# Patient Record
Sex: Female | Born: 1960 | Hispanic: No | Marital: Single | Smoking: Never smoker
Health system: Southern US, Community
[De-identification: ages and names within clinical notes are randomized; demographics above are authoritative.]

## PROBLEM LIST (undated history)

## (undated) DIAGNOSIS — E559 Vitamin D deficiency, unspecified: Secondary | ICD-10-CM

## (undated) DIAGNOSIS — M545 Low back pain, unspecified: Secondary | ICD-10-CM

## (undated) DIAGNOSIS — R6882 Decreased libido: Secondary | ICD-10-CM

## (undated) DIAGNOSIS — R87610 Atypical squamous cells of undetermined significance on cytologic smear of cervix (ASC-US): Secondary | ICD-10-CM

## (undated) DIAGNOSIS — Z309 Encounter for contraceptive management, unspecified: Secondary | ICD-10-CM

## (undated) DIAGNOSIS — B019 Varicella without complication: Secondary | ICD-10-CM

## (undated) DIAGNOSIS — R5381 Other malaise: Secondary | ICD-10-CM

## (undated) DIAGNOSIS — R5383 Other fatigue: Secondary | ICD-10-CM

## (undated) DIAGNOSIS — G43109 Migraine with aura, not intractable, without status migrainosus: Secondary | ICD-10-CM

## (undated) HISTORY — DX: Vitamin D deficiency, unspecified: E55.9

## (undated) HISTORY — DX: Atypical squamous cells of undetermined significance on cytologic smear of cervix (ASC-US): R87.610

## (undated) HISTORY — DX: Migraine with aura, not intractable, without status migrainosus: G43.109

## (undated) HISTORY — DX: Varicella without complication: B01.9

## (undated) HISTORY — DX: Decreased libido: R68.82

## (undated) HISTORY — DX: Low back pain, unspecified: M54.50

## (undated) HISTORY — PX: OTHER SURGICAL HISTORY: SHX169

## (undated) HISTORY — DX: Other malaise: R53.81

## (undated) HISTORY — DX: Encounter for contraceptive management, unspecified: Z30.9

## (undated) HISTORY — DX: Low back pain: M54.5

## (undated) HISTORY — DX: Other fatigue: R53.83

---

## 2008-03-08 ENCOUNTER — Emergency Department: Payer: Self-pay | Admitting: Internal Medicine

## 2009-03-01 LAB — HM PAP SMEAR

## 2010-09-17 IMAGING — CT CT HEAD WITHOUT CONTRAST
1 series · 16 of 29 positions shown, 20 images · non-contrast
Comparison: none

RESULT:      Noncontrast emergent CT of the brain demonstrates prominence of
the ventricles and sulci consistent with atrophy. There is no hemorrhage,
mass-effect or midline shift. There is no extra-axial hematoma. There is no
territorial infarct. The sinuses and mastoids are unremarkable aside from
chronic sclerotic changes in the mastoids. The calvarium is intact.

[Series 2: soft tissue · axial · 0.41mm/px · z∈[+852,+982]mm · 16 of 29 slices shown, 20 images]
[im 2/29  brain]
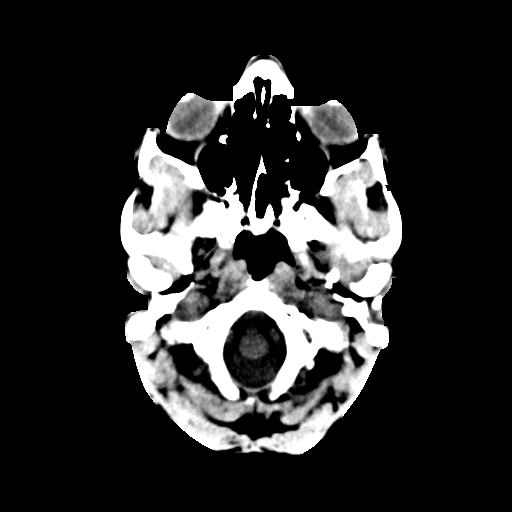
[im 2/29  bone]
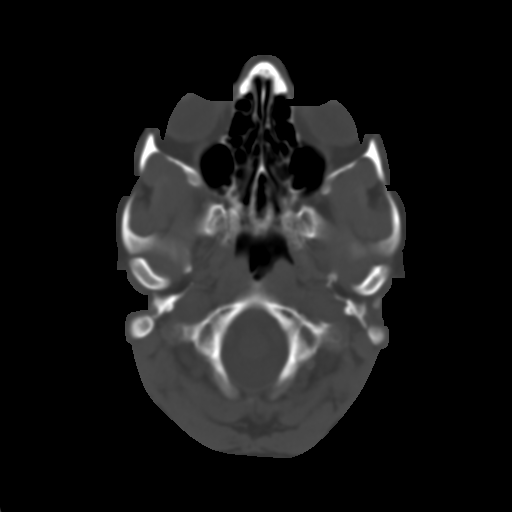
[im 4/29  brain]
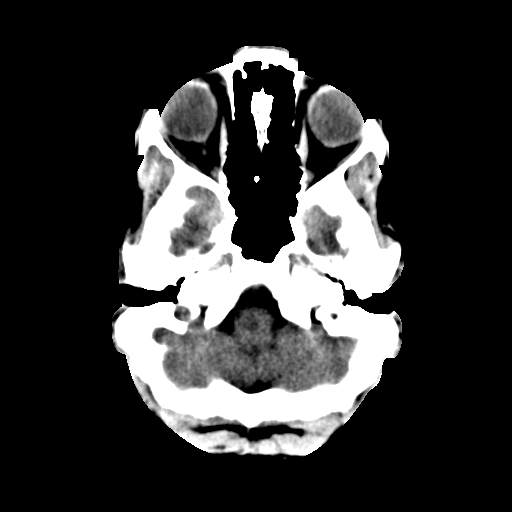
[im 6/29  brain]
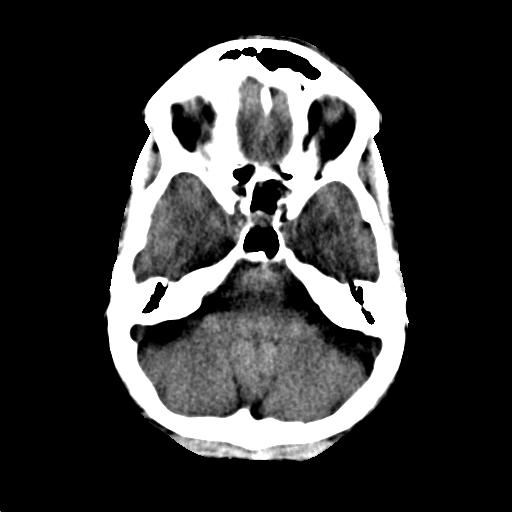
[im 7/29  brain]
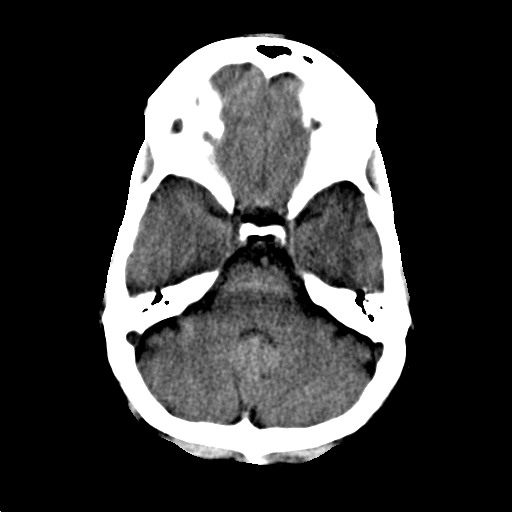
[im 9/29  brain]
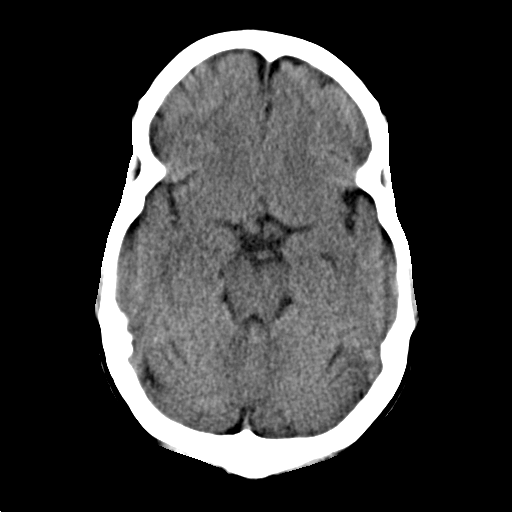
[im 9/29  bone]
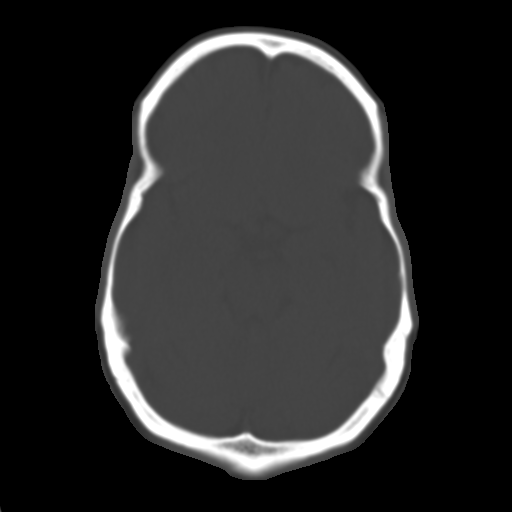
[im 11/29  brain]
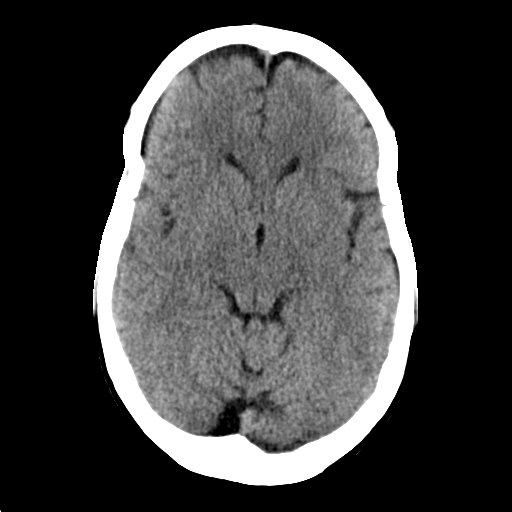
[im 12/29  brain]
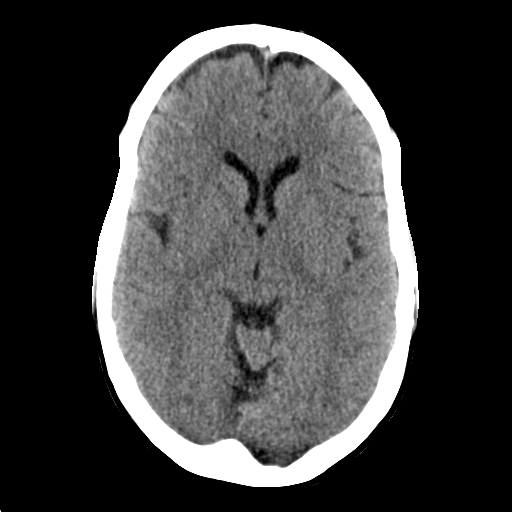
[im 14/29  brain]
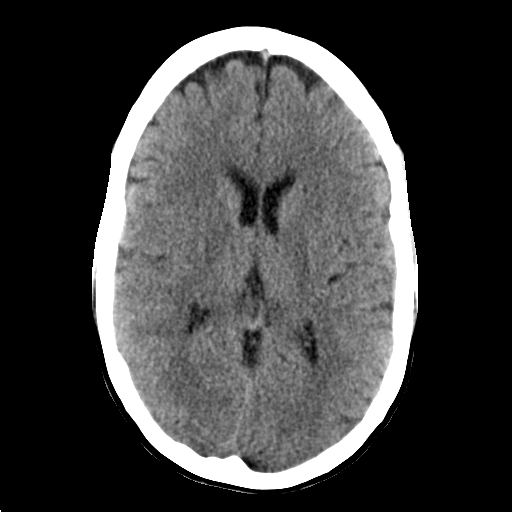
[im 16/29  brain]
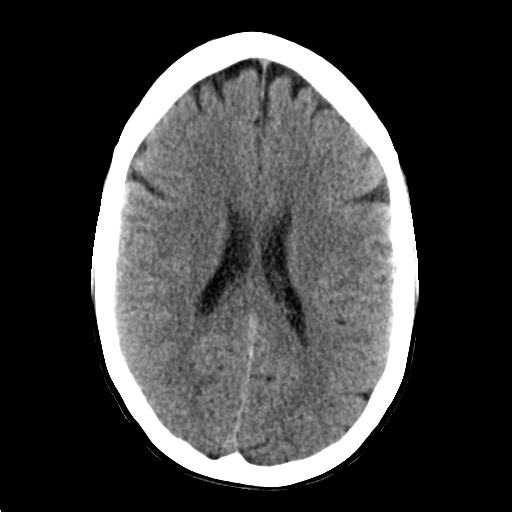
[im 16/29  bone]
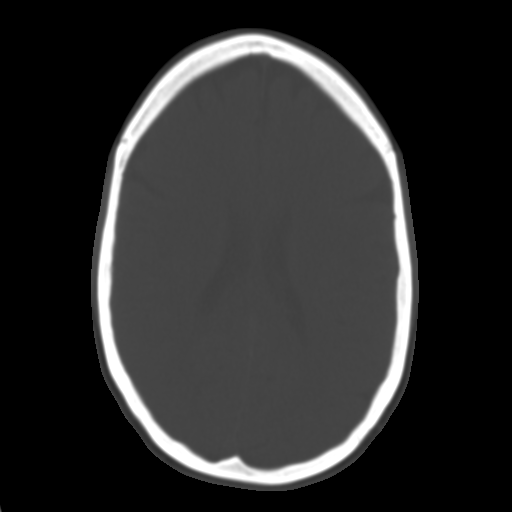
[im 18/29  brain]
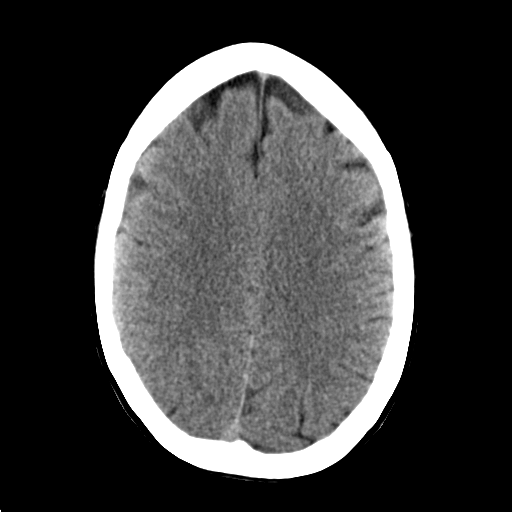
[im 19/29  brain]
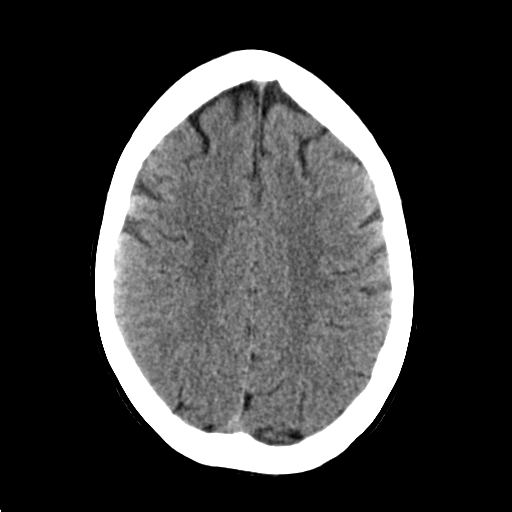
[im 21/29  brain]
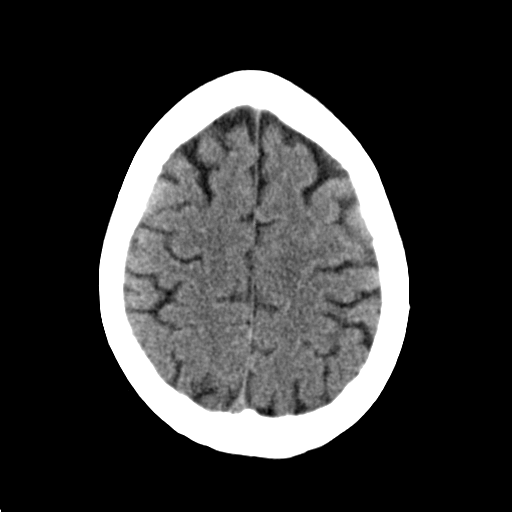
[im 23/29  brain]
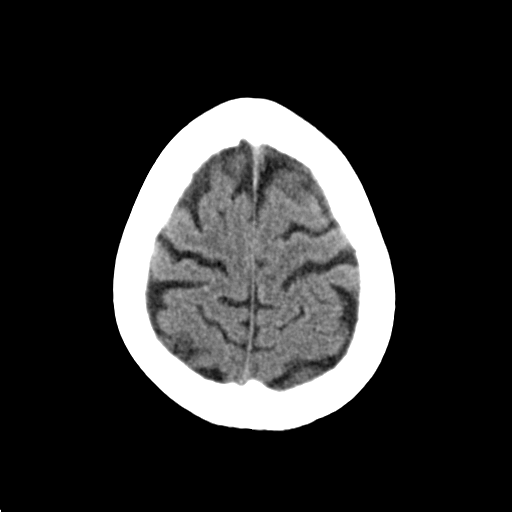
[im 23/29  bone]
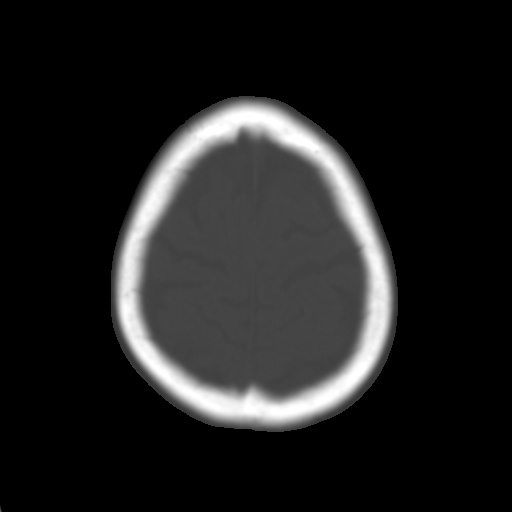
[im 24/29  brain]
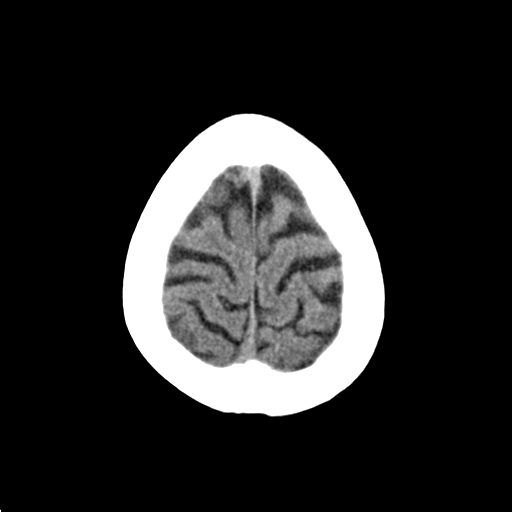
[im 26/29  brain]
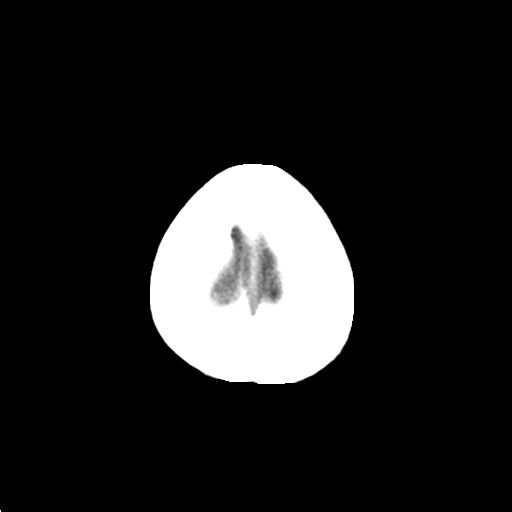
[im 28/29  brain]
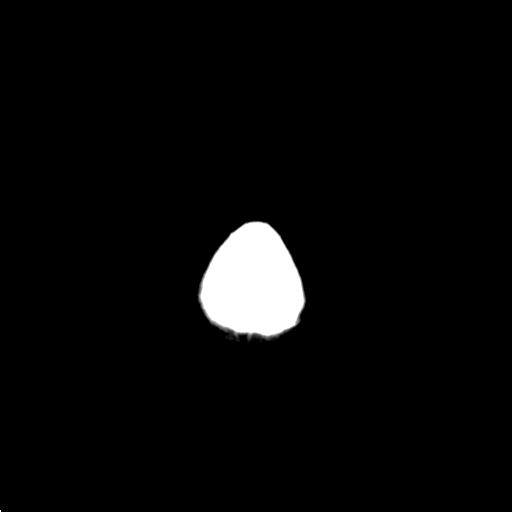

[16 of 29 positions shown; findings below may reference images not displayed]

IMPRESSION: 1. Changes of atrophy and chronic microvascular ischemic disease.
2. No acute intracranial abnormality.

Addendum:
There are is mild diffuse atrophy. The low attenuation area suggestive of
possible chronic microvascular ischemic disease are extremely minimal and
not widespread. The predominant finding is mild atrophy

## 2011-02-26 ENCOUNTER — Ambulatory Visit: Payer: Self-pay | Admitting: Family Medicine

## 2011-06-12 ENCOUNTER — Ambulatory Visit: Payer: Self-pay | Admitting: Family Medicine

## 2011-06-12 LAB — HM MAMMOGRAPHY

## 2012-03-01 ENCOUNTER — Encounter: Payer: Self-pay | Admitting: Family Medicine

## 2012-03-03 ENCOUNTER — Encounter: Payer: Self-pay | Admitting: *Deleted

## 2013-09-06 IMAGING — CR DG LUMBAR SPINE COMPLETE 4+V
1 series · 5 of 5 positions shown · non-contrast
Comparison: none

REASON FOR EXAM: low back pain lumbago
COMMENTS:

PROCEDURE:     KDR - KDXR LUMBAR SPINE WITH OBLIQUES  - February 26, 2011  [DATE]
RESULT:     Vertebral body heights and intervertebral disc spaces appear to
be maintained. No fracture or significant degenerative disease is evident.

[Series 1: view not recorded · 0.17mm/px · 5 of 5 slices shown]
[im 1/5]
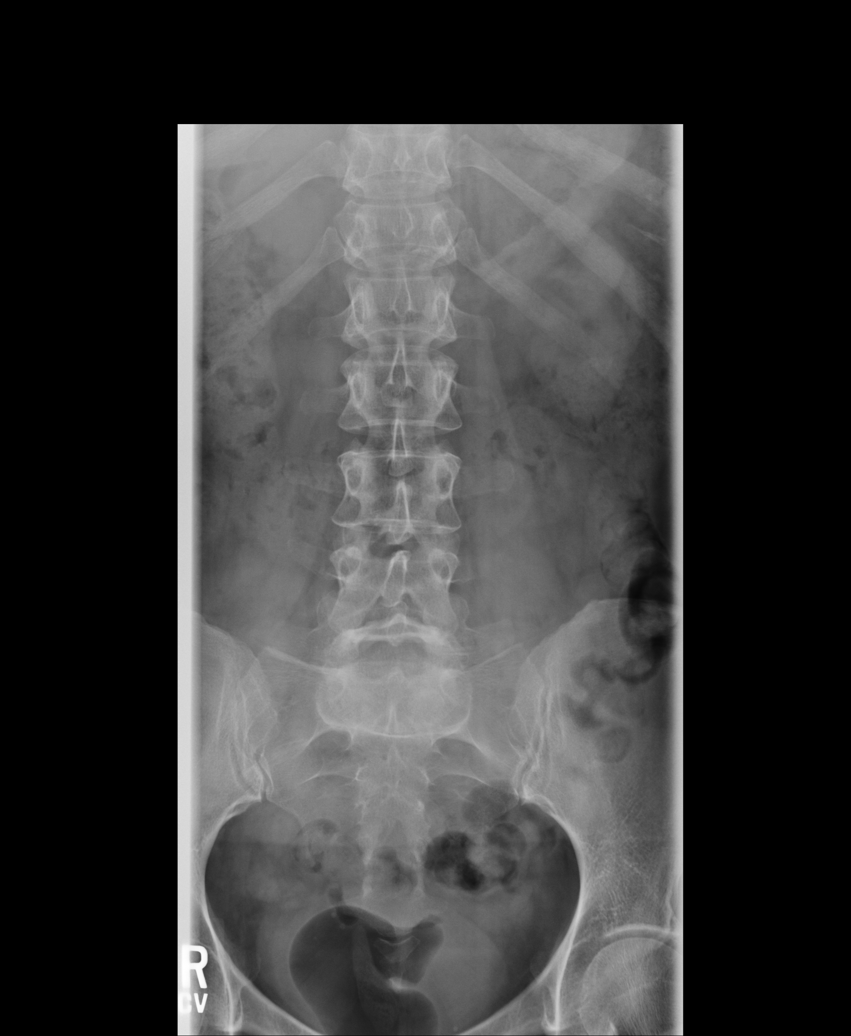
[im 2/5]
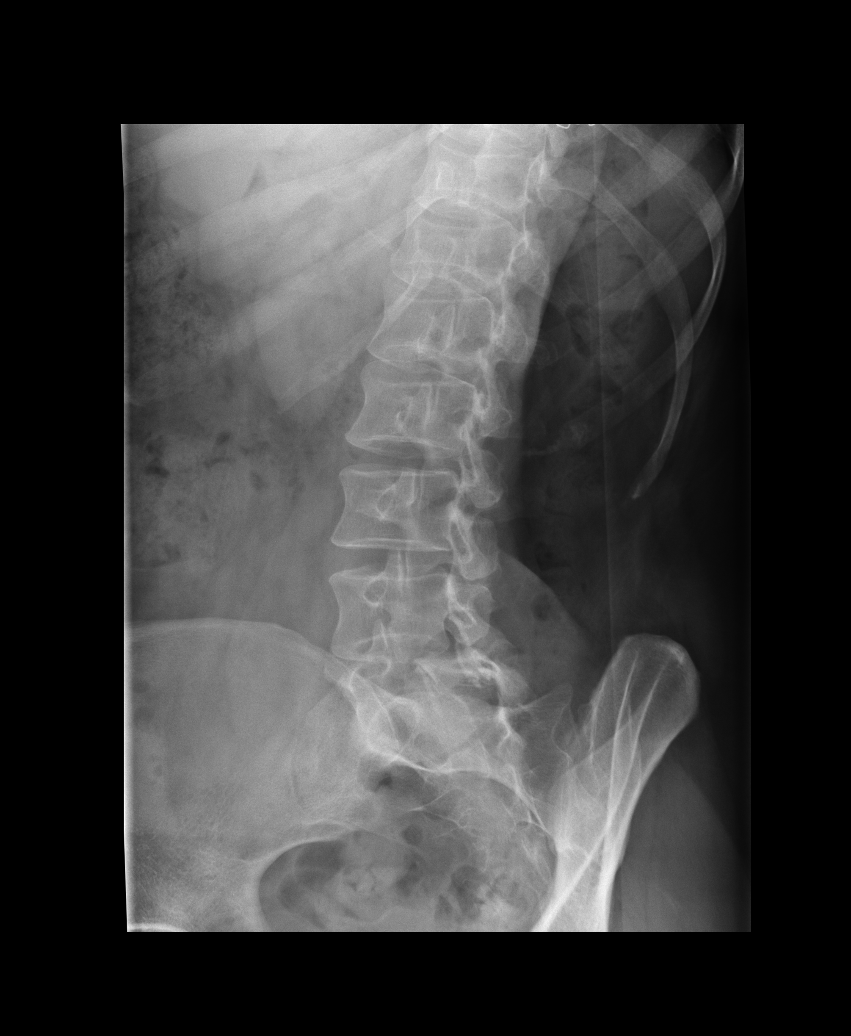
[im 3/5]
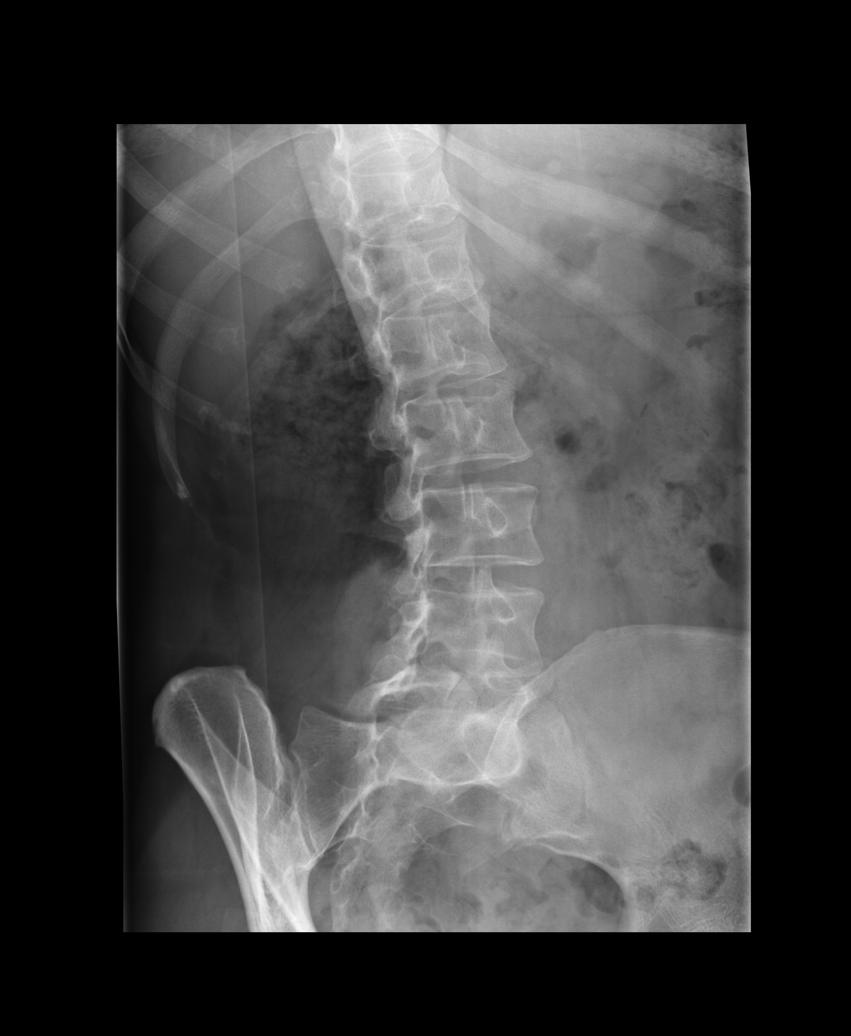
[im 4/5]
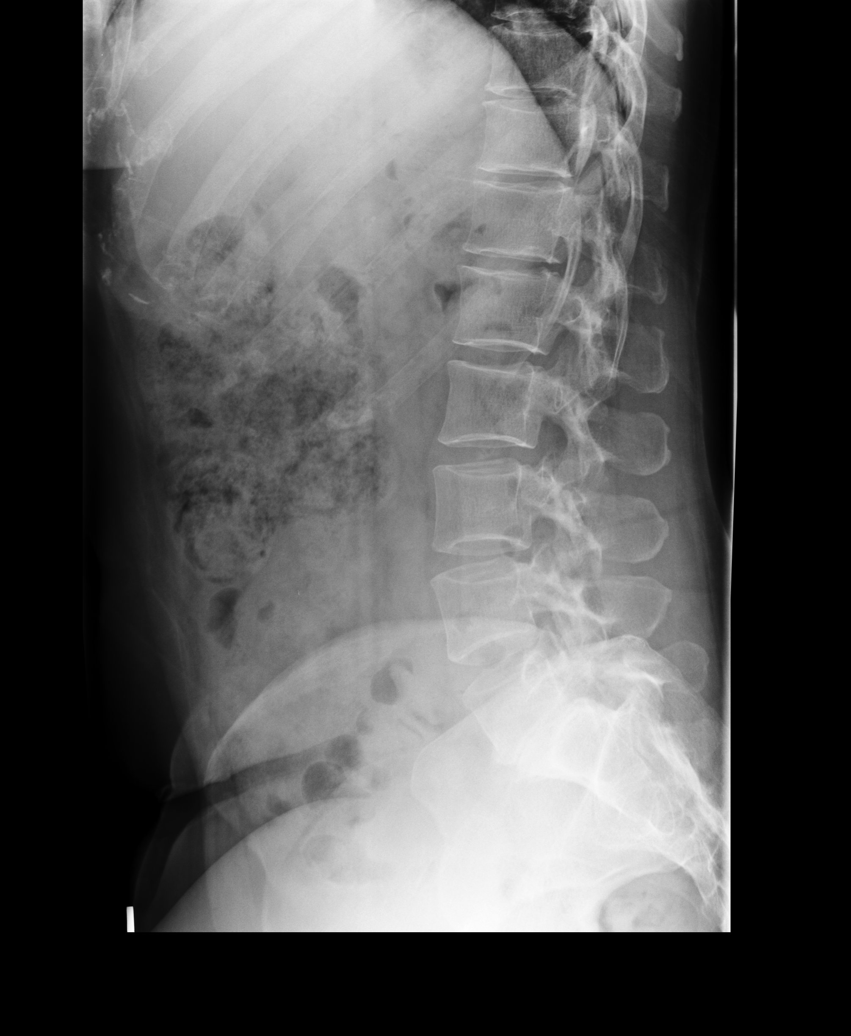
[im 5/5]
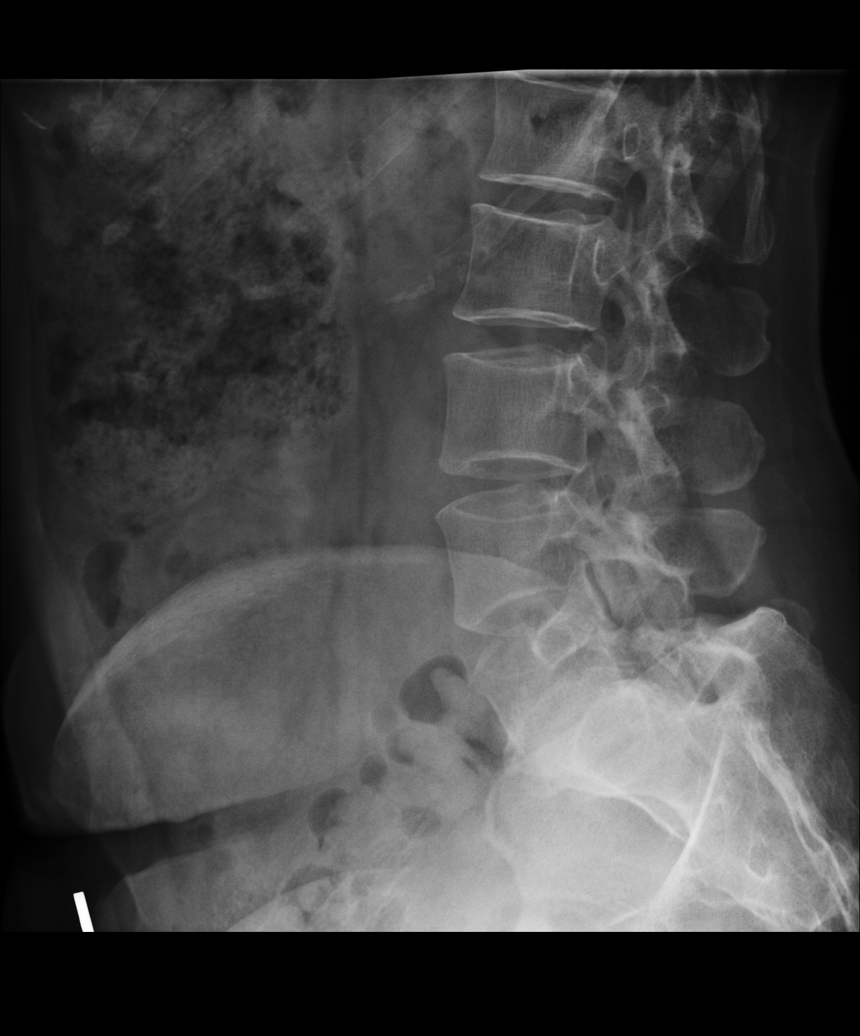

[5 of 5 positions shown; findings below may reference images not displayed]

IMPRESSION: No acute bony abnormalities demonstrated. MRI followup can
be obtained if there is concern for spinal canal or foraminal stenosis or
occult fracture.

## 2014-03-19 ENCOUNTER — Encounter: Payer: Self-pay | Admitting: Family Medicine

## 2014-03-19 ENCOUNTER — Ambulatory Visit (INDEPENDENT_AMBULATORY_CARE_PROVIDER_SITE_OTHER): Payer: BC Managed Care – PPO | Admitting: Family Medicine

## 2014-03-19 VITALS — BP 110/86 | HR 76 | Temp 98.1°F | Resp 16 | Ht 61.75 in | Wt 146.2 lb

## 2014-03-19 DIAGNOSIS — E559 Vitamin D deficiency, unspecified: Secondary | ICD-10-CM

## 2014-03-19 DIAGNOSIS — Z0000 Encounter for general adult medical examination without abnormal findings: Secondary | ICD-10-CM

## 2014-03-19 DIAGNOSIS — Z1211 Encounter for screening for malignant neoplasm of colon: Secondary | ICD-10-CM

## 2014-03-19 DIAGNOSIS — Z13220 Encounter for screening for lipoid disorders: Secondary | ICD-10-CM

## 2014-03-19 DIAGNOSIS — Z7251 High risk heterosexual behavior: Secondary | ICD-10-CM

## 2014-03-19 DIAGNOSIS — G43109 Migraine with aura, not intractable, without status migrainosus: Secondary | ICD-10-CM

## 2014-03-19 DIAGNOSIS — N951 Menopausal and female climacteric states: Secondary | ICD-10-CM

## 2014-03-19 DIAGNOSIS — Z131 Encounter for screening for diabetes mellitus: Secondary | ICD-10-CM

## 2014-03-19 DIAGNOSIS — Z01419 Encounter for gynecological examination (general) (routine) without abnormal findings: Secondary | ICD-10-CM

## 2014-03-19 LAB — CBC WITH DIFFERENTIAL/PLATELET
Basophils Absolute: 0 10*3/uL (ref 0.0–0.1)
Basophils Relative: 1 % (ref 0–1)
EOS PCT: 2 % (ref 0–5)
Eosinophils Absolute: 0.1 10*3/uL (ref 0.0–0.7)
HEMATOCRIT: 38.8 % (ref 36.0–46.0)
Hemoglobin: 13.3 g/dL (ref 12.0–15.0)
LYMPHS ABS: 1.7 10*3/uL (ref 0.7–4.0)
LYMPHS PCT: 36 % (ref 12–46)
MCH: 32.6 pg (ref 26.0–34.0)
MCHC: 34.3 g/dL (ref 30.0–36.0)
MCV: 95.1 fL (ref 78.0–100.0)
MONO ABS: 0.3 10*3/uL (ref 0.1–1.0)
Monocytes Relative: 7 % (ref 3–12)
Neutro Abs: 2.5 10*3/uL (ref 1.7–7.7)
Neutrophils Relative %: 54 % (ref 43–77)
PLATELETS: 244 10*3/uL (ref 150–400)
RBC: 4.08 MIL/uL (ref 3.87–5.11)
RDW: 13.5 % (ref 11.5–15.5)
WBC: 4.7 10*3/uL (ref 4.0–10.5)

## 2014-03-19 LAB — POCT URINALYSIS DIPSTICK
Bilirubin, UA: NEGATIVE
Blood, UA: NEGATIVE
GLUCOSE UA: NEGATIVE
LEUKOCYTES UA: NEGATIVE
NITRITE UA: NEGATIVE
PROTEIN UA: NEGATIVE
SPEC GRAV UA: 1.015
UROBILINOGEN UA: 0.2
pH, UA: 7.5

## 2014-03-19 LAB — COMPLETE METABOLIC PANEL WITH GFR
ALBUMIN: 4 g/dL (ref 3.5–5.2)
ALT: 10 U/L (ref 0–35)
AST: 13 U/L (ref 0–37)
Alkaline Phosphatase: 66 U/L (ref 39–117)
BUN: 15 mg/dL (ref 6–23)
CALCIUM: 8.9 mg/dL (ref 8.4–10.5)
CO2: 21 meq/L (ref 19–32)
Chloride: 107 mEq/L (ref 96–112)
Creat: 0.89 mg/dL (ref 0.50–1.10)
GFR, EST AFRICAN AMERICAN: 84 mL/min
GFR, Est Non African American: 73 mL/min
GLUCOSE: 102 mg/dL — AB (ref 70–99)
POTASSIUM: 4.3 meq/L (ref 3.5–5.3)
SODIUM: 139 meq/L (ref 135–145)
TOTAL PROTEIN: 6.8 g/dL (ref 6.0–8.3)
Total Bilirubin: 0.4 mg/dL (ref 0.2–1.2)

## 2014-03-19 LAB — HEMOGLOBIN A1C
Hgb A1c MFr Bld: 5.9 % — ABNORMAL HIGH (ref <5.7)
Mean Plasma Glucose: 123 mg/dL — ABNORMAL HIGH (ref <117)

## 2014-03-19 LAB — LIPID PANEL
Cholesterol: 203 mg/dL — ABNORMAL HIGH (ref 0–200)
HDL: 57 mg/dL (ref >39)
LDL CALC: 112 mg/dL — AB (ref 0–99)
Total CHOL/HDL Ratio: 3.6 Ratio
Triglycerides: 172 mg/dL — ABNORMAL HIGH (ref <150)
VLDL: 34 mg/dL (ref 0–40)

## 2014-03-19 LAB — TSH: TSH: 2.989 u[IU]/mL (ref 0.350–4.500)

## 2014-03-19 LAB — RPR

## 2014-03-19 LAB — HIV ANTIBODY (ROUTINE TESTING W REFLEX): HIV: NONREACTIVE

## 2014-03-19 MED ORDER — KETOROLAC TROMETHAMINE 10 MG PO TABS: 10.0000 mg | ORAL_TABLET | Freq: Four times a day (QID) | ORAL | Status: DC | PRN

## 2014-03-19 MED ORDER — FROVATRIPTAN SUCCINATE 2.5 MG PO TABS: 2.5000 mg | ORAL_TABLET | ORAL | Status: DC | PRN

## 2014-03-19 MED ORDER — CONJ ESTROG-MEDROXYPROGEST ACE 0.625-5 MG PO TABS: 1.0000 | ORAL_TABLET | Freq: Every day | ORAL | Status: AC

## 2014-03-19 MED ORDER — TOPIRAMATE 25 MG PO TABS: 25.0000 mg | ORAL_TABLET | Freq: Two times a day (BID) | ORAL | Status: DC

## 2014-03-19 NOTE — Progress Notes (Signed)
Subjective:  This chart was scribed for [employer] Energy CompanyKristi M. Katrinka BlazingSmith, MD by Marica OtterNusrat Rahman, ED Scribe. This patient was seen in room 22 and the patient's care was started at 9:09 AM.     Patient ID: Jenny Price, female    DOB: 02/19/1961, 53 y.o.   MRN: [id number]  03/19/2014  Annual Exam   HPI HPI Comments: Jenny Price is a 53 y.o. female, with medical Hx noted below and significant for migraines, who presents to the [facility] to establish care and for her annual exam. Pt [place] location in the past in 02/2012.   Meds:  Flexeril-- as needed for migraines but does not like to take it due to drowsiness. Toradol-- as needed for migraines Topamax-- 1X a day for migraine prevention. Drisdol-- pt is no longer taking the med Vicodin-- as needed for migraines Prempro---daily for menopausal atrophic vaginitis and hot flashes.  PHYSICAL--  Pt reports her last physical was 2 years ago and notes she still has not done her colonoscopy.   Colonoscopy: Pt notes that she plans to get her colonoscopy this year; and is agreeable to being set up for a colonoscopy.   Mammogram & Pap: Pt reports her mammogram and pap smear was also 2 years ago and she is past due for both. Pt agrees that she will call and set up her own appointment for a mammogram.   Migraine: Pt reports she is currently having a migraine that began this morning. Pt notes this is her first migraine in 6 months. Pt reports she is compliant with her migraine meds; and reports takingTopamax daily.   Flu Shot: Pt reports she received her flu shot last week  Eye Exam: Pt reports she gets an eye exam every January. Pt[place] eye [place] in [place]. Pt reports she wears contacts at baseline.   Dentist: Pt reports her dentist is Dr. Smitty Cordsouloupas with [facility] located in [place], [place].   Hospitalization: None   Hearing: Pt reports her hearing has been good.     Cough: Pt reports she has had a cough for the past couple of weeks and equates it to allergies.   Ear: Pt denies any Sx associated with her ears.   GU: Pt reports she was experiencing some vaginal dryness with intercourse, however, prempro resolved the Sx. Pt denies trying, or having any interest in trying, estrogen creams. Pt denies any other vaginal Sx and states she is otherwise doing well.    Tobacco Use: Pt denies any Hx of tobacco use.  Alcohol Use: Pt reports having 3-4 drinks a week.   Exercise & Weight: Pt reports she is exercising. Pt has gained 4 lbs since her last physical in 02/2012.   Seatbelt Use: Pt states she wears her seatbelt without exception.  Night Routine: Pt reports she goes to bed around 11pm-12am and is getting on average 7 hours of sleep per night. Though, pt notes that since she got Netflix she is sleeping less as she is up watching television. Pt denies snoring.   Denials: Pt denies tinnitus, blurred vision, double vision, sores, chest pain, palpitations, SOB, chronic cough, neck pain, shoulder pain, numbness/tingling or UE/LE, weakness of UE/LE, swelling of the LE, back pain, constipation, diarrhea, bloody stool, frequent abd pain, n/v, frequency (pt notes she gets up to use the restroom 1x a night intermittently), any vaginal Sx.   Family Hx: Mother is 55  years old, has arthritis and is otherwise healthy. Pt's father died at 46 of esophageal cancer. Pt notes her father was also a heavy alcohol user. Pt has one sister and she has a disc degeneration lumbar spine. Pt notes her sister is leads a very unhealthy lifestyle. Pt reports she has three bothers. The oldest brother has high BP, the middle brother lives in [place] and is healthy, and the youngest brother is also in good health. Pt notes her mother's father lived to be 77 years old.   Social Hx: Pt has been in a relationship for the past two years. Pt was living alone until her 48 year old daughter moved back in  recently. Pt reports her daughter is back in school and has plans to go to medical school. Pt also has a son who lives in [place], [place].   Pt states that her roommate from college recently passed away from colorectal cancer. Pt notes that her roommate was a "health nut," and her unexpected fatal Dx has led her to be more serious regarding her own health.   Review of Systems  Constitutional: Negative for fever, chills, diaphoresis, activity change, appetite change, fatigue and unexpected weight change.  HENT: Negative for congestion, dental problem, drooling, ear discharge, ear pain, facial swelling, hearing loss, mouth sores, nosebleeds, postnasal drip, rhinorrhea, sinus pressure, sneezing, sore throat, tinnitus, trouble swallowing and voice change.   Eyes: Negative.  Negative for photophobia, pain, discharge, redness, itching and visual disturbance.  Respiratory: Positive for cough. Negative for apnea, choking, chest tightness, shortness of breath, wheezing and stridor.   Cardiovascular: Negative for chest pain, palpitations and leg swelling.  Gastrointestinal: Negative for nausea, vomiting, abdominal pain, diarrhea, constipation, blood in stool, abdominal distention, anal bleeding and rectal pain.  Endocrine: Negative for cold intolerance, heat intolerance, polydipsia, polyphagia and polyuria.  Genitourinary: Negative for dysuria, urgency, frequency, hematuria, flank pain, decreased urine volume, vaginal bleeding, vaginal discharge, enuresis, difficulty urinating, genital sores, vaginal pain, menstrual problem, pelvic pain and dyspareunia.  Musculoskeletal: Negative.  Negative for arthralgias, back pain, gait problem, joint swelling, myalgias, neck pain and neck stiffness.  Skin: Negative for color change, pallor, rash and wound.  Allergic/Immunologic: Negative for environmental allergies, food allergies and immunocompromised state.  Neurological: Positive for headaches. Negative for dizziness,  tremors, seizures, syncope, facial asymmetry, speech difficulty, weakness, light-headedness and numbness.  Hematological: Negative for adenopathy. Does not bruise/bleed easily.  Psychiatric/Behavioral: Negative for suicidal ideas, hallucinations, behavioral problems, confusion, sleep disturbance, self-injury, dysphoric mood, decreased concentration and agitation. The patient is not nervous/anxious and is not hyperactive.     Past Medical History  Diagnosis Date  . Unspecified vitamin D deficiency   . Migraine with aura, without mention of intractable migraine without mention of status migrainosus   . Lumbago   . Other malaise and fatigue   . Decreased libido   . Papanicolaou smear of cervix with atypical squamous cells of undetermined significance (ASC-US)   . Unspecified contraceptive management   . Chicken pox    Past Surgical History  Procedure Laterality Date  . Vein removal      left leg     [facility] [place]   No Known Allergies Current Outpatient Prescriptions  Medication Sig Dispense Refill  . cyclobenzaprine (FLEXERIL) 5 MG tablet Take 5 mg by mouth 3 (three) times daily as needed.      . Vitamin D, Ergocalciferol, (DRISDOL) 50000 UNITS CAPS Take 50,000 Units by mouth every 7 (seven) days.      Marland Kitchen  estrogen, conjugated,-medroxyprogesterone (PREMPRO) 0.625-5 MG per tablet Take 1 tablet by mouth daily.  90 tablet  3  . frovatriptan (FROVA) 2.5 MG tablet Take 1 tablet (2.5 mg total) by mouth as needed for migraine. If recurs, may repeat after 2 hours. Max of 3 tabs in 24 hours.  10 tablet  11  . ketorolac (TORADOL) 10 MG tablet Take 1 tablet (10 mg total) by mouth every 6 (six) hours as needed.  40 tablet  3  . topiramate (TOPAMAX) 25 MG tablet Take 1 tablet (25 mg total) by mouth 2 (two) times daily.  90 tablet  3   No current facility-administered medications for this visit.       Objective:    Triage Vitals: BP 110/86  Pulse 76  Temp(Src) 98.1 F (36.7 C) (Oral)  Resp 16   Ht 5' 1.75" (1.568 m)  Wt 146 lb 3.2 oz (66.316 kg)  BMI 26.97 kg/m2  SpO2 98% Physical Exam  Nursing note and vitals reviewed. Constitutional: She is oriented to person, place, and time. She appears well-developed and well-nourished. No distress.  HENT:  Head: Normocephalic and atraumatic.  Right Ear: External ear normal.  Left Ear: External ear normal.  Nose: Nose normal.  Mouth/Throat: Oropharynx is clear and moist.  Eyes: Conjunctivae and EOM are normal. Pupils are equal, round, and reactive to light.  Neck: Normal range of motion and full passive range of motion without pain. Neck supple. No JVD present. Carotid bruit is not present. No tracheal deviation present. No thyromegaly present.  Cardiovascular: Normal rate, regular rhythm, normal heart sounds and intact distal pulses.  Exam reveals no gallop and no friction rub.   No murmur heard. Pulmonary/Chest: Effort normal and breath sounds normal. No respiratory distress. She has no wheezes. She has no rales. Right breast exhibits no inverted nipple, no mass, no nipple discharge, no skin change and no tenderness. Left breast exhibits no inverted nipple, no mass, no nipple discharge, no skin change and no tenderness. Breasts are symmetrical.  Abdominal: Soft. Bowel sounds are normal. She exhibits no distension and no mass. There is no tenderness. There is no rebound and no guarding.  Genitourinary: Uterus normal.  Exam performed by Nilda SimmerKristi Tamarion Haymond, MD  exam chaperoned Date: 03/19/2014 Pelvic exam: normal external genitalia without evidence of trauma. VULVA: normal appearing vulva with no masses, tenderness or lesion. VAGINA: normal appearing vagina with normal color and discharge, no lesions. CERVIX: normal appearing cervix without lesions, cervical motion tenderness absent, cervical os closed without purulent discharge; vaginal discharge absent. DNA probe for chlamydia and GC obtained.   ADNEXA: normal adnexa in size, nontender and no  masses UTERUS: uterus is normal size, shape, consistency and nontender.    Musculoskeletal: Normal range of motion.       Right shoulder: Normal.       Left shoulder: Normal.       Cervical back: Normal.  Lymphadenopathy:    She has no cervical adenopathy.  Neurological: She is alert and oriented to person, place, and time. She has normal reflexes. No cranial nerve deficit. She exhibits normal muscle tone. Coordination normal.  Skin: Skin is warm and dry. No rash noted. She is not diaphoretic. No erythema. No pallor.  Psychiatric: She has a normal mood and affect. Her behavior is normal. Judgment and thought content normal.   Results for orders placed in visit on 03/19/14  POCT URINALYSIS DIPSTICK      Result Value Ref Range   Color, UA yellow  Clarity, UA clear     Glucose, UA neg     Bilirubin, UA neg     Ketones, UA trace     Spec Grav, UA 1.015     Blood, UA neg     pH, UA 7.5     Protein, UA neg     Urobilinogen, UA 0.2     Nitrite, UA neg     Leukocytes, UA Negative         Assessment & Plan:   1. Laboratory tests ordered as part of a complete physical exam (CPE)   2. Physical exam, annual   3. Encounter for routine gynecological examination   4. High risk sexual behavior   5. Vitamin D deficiency   6. Migraine with aura and without status migrainosus, not intractable   7. Menopausal syndrome   8. Screening for colon cancer     1.  Complete Physical Examination: anticipatory guidance --- exercise, weight loss. Pap smear obtained; refer for mammogram.  Refer for colonoscopy. Immunizations UTD. 2. Gynecological exam: pap smear obtained; refer for mammogram; menopausal. 3.  High risk sexual behavior: obtain GC/Chlam/RPR/HIV. Counseling provided. 4.  Migraines: controlled; refill of medications provided. 5.  Menopausal symptoms: controlled with Prempro; refill provided. 6.  Vitamin D deficiency: stable; obtain labs. 7.  Screening for colon cancer: refer for  colonoscopy.   COORDINATION OF CARE: 9:39 AM-Discussed treatment plan which includes pelvic exam, referral for mammogram, scheduling appointment for colonoscopy with pt at bedside and pt agreed to plan.    Meds ordered this encounter  Medications  . DISCONTD: estrogen, conjugated,-medroxyprogesterone (PREMPRO) 0.625-5 MG per tablet    Sig: Take 1 tablet by mouth daily.  Marland Kitchen estrogen, conjugated,-medroxyprogesterone (PREMPRO) 0.625-5 MG per tablet    Sig: Take 1 tablet by mouth daily.    Dispense:  90 tablet    Refill:  3  . frovatriptan (FROVA) 2.5 MG tablet    Sig: Take 1 tablet (2.5 mg total) by mouth as needed for migraine. If recurs, may repeat after 2 hours. Max of 3 tabs in 24 hours.    Dispense:  10 tablet    Refill:  11  . topiramate (TOPAMAX) 25 MG tablet    Sig: Take 1 tablet (25 mg total) by mouth 2 (two) times daily.    Dispense:  90 tablet    Refill:  3  . ketorolac (TORADOL) 10 MG tablet    Sig: Take 1 tablet (10 mg total) by mouth every 6 (six) hours as needed.    Dispense:  40 tablet    Refill:  3    No Follow-up on file.  I personally performed the services described in this documentation, which was scribed in my presence.  The recorded information has been reviewed and is accurate.  Nilda Simmer, M.D.  [facility] [address] [place], [place]  [address] [phone] phone [phone] fax

## 2014-03-19 NOTE — Patient Instructions (Signed)

## 2014-03-20 LAB — VITAMIN D 25 HYDROXY (VIT D DEFICIENCY, FRACTURES): Vit D, 25-Hydroxy: 30 ng/mL (ref 30–89)

## 2014-03-21 LAB — PAP IG, CT-NG NAA, HPV HIGH-RISK
Chlamydia Probe Amp: NEGATIVE
GC PROBE AMP: NEGATIVE
HPV DNA HIGH RISK: NOT DETECTED

## 2014-10-03 ENCOUNTER — Ambulatory Visit
Admit: 2014-10-03 | Disposition: A | Discharge status: Home / Self Care | Payer: Self-pay | Attending: Family Medicine | Admitting: Family Medicine

## 2014-10-05 ENCOUNTER — Ambulatory Visit
Admit: 2014-10-05 | Disposition: A | Discharge status: Home / Self Care | Payer: Self-pay | Attending: Gastroenterology | Admitting: Gastroenterology

## 2014-10-22 ENCOUNTER — Encounter: Payer: Self-pay | Admitting: *Deleted

## 2015-03-01 ENCOUNTER — Encounter: Payer: Self-pay | Admitting: Family Medicine

## 2016-01-21 ENCOUNTER — Encounter: Payer: Self-pay | Admitting: Family Medicine

## 2016-01-21 ENCOUNTER — Ambulatory Visit (INDEPENDENT_AMBULATORY_CARE_PROVIDER_SITE_OTHER): Payer: BLUE CROSS/BLUE SHIELD | Admitting: Family Medicine

## 2016-01-21 VITALS — BP 134/76 | HR 65 | Temp 98.5°F | Ht 60.5 in | Wt 148.6 lb

## 2016-01-21 DIAGNOSIS — Z7251 High risk heterosexual behavior: Secondary | ICD-10-CM

## 2016-01-21 DIAGNOSIS — Z136 Encounter for screening for cardiovascular disorders: Secondary | ICD-10-CM | POA: Diagnosis not present

## 2016-01-21 DIAGNOSIS — G43109 Migraine with aura, not intractable, without status migrainosus: Secondary | ICD-10-CM | POA: Diagnosis not present

## 2016-01-21 DIAGNOSIS — Z114 Encounter for screening for human immunodeficiency virus [HIV]: Secondary | ICD-10-CM

## 2016-01-21 DIAGNOSIS — Z0000 Encounter for general adult medical examination without abnormal findings: Secondary | ICD-10-CM | POA: Diagnosis not present

## 2016-01-21 DIAGNOSIS — N952 Postmenopausal atrophic vaginitis: Secondary | ICD-10-CM

## 2016-01-21 DIAGNOSIS — E559 Vitamin D deficiency, unspecified: Secondary | ICD-10-CM

## 2016-01-21 DIAGNOSIS — Z1159 Encounter for screening for other viral diseases: Secondary | ICD-10-CM

## 2016-01-21 DIAGNOSIS — Z131 Encounter for screening for diabetes mellitus: Secondary | ICD-10-CM

## 2016-01-21 DIAGNOSIS — Z124 Encounter for screening for malignant neoplasm of cervix: Secondary | ICD-10-CM | POA: Diagnosis not present

## 2016-01-21 DIAGNOSIS — Z13220 Encounter for screening for lipoid disorders: Secondary | ICD-10-CM

## 2016-01-21 LAB — CBC WITH DIFFERENTIAL/PLATELET
BASOS ABS: 0 {cells}/uL (ref 0–200)
Basophils Relative: 0 %
Eosinophils Absolute: 45 cells/uL (ref 15–500)
Eosinophils Relative: 1 %
HEMATOCRIT: 41.1 % (ref 35.0–45.0)
Hemoglobin: 14 g/dL (ref 11.7–15.5)
LYMPHS ABS: 2070 {cells}/uL (ref 850–3900)
MCH: 32.4 pg (ref 27.0–33.0)
MCHC: 34.1 g/dL (ref 32.0–36.0)
MCV: 95.1 fL (ref 80.0–100.0)
MPV: 10.9 fL (ref 7.5–12.5)
Monocytes Absolute: 270 cells/uL (ref 200–950)
Monocytes Relative: 6 %
NEUTROS PCT: 47 %
Neutro Abs: 2115 cells/uL (ref 1500–7800)
Platelets: 235 10*3/uL (ref 140–400)
RBC: 4.32 MIL/uL (ref 3.80–5.10)
RDW: 14 % (ref 11.0–15.0)
WBC: 4.5 10*3/uL (ref 3.8–10.8)

## 2016-01-21 LAB — POCT WET + KOH PREP
TRICH BY WET PREP: ABSENT
YEAST BY WET PREP: ABSENT
Yeast by KOH: ABSENT

## 2016-01-21 LAB — POCT URINALYSIS DIP (MANUAL ENTRY)
Bilirubin, UA: NEGATIVE
Glucose, UA: NEGATIVE
Ketones, POC UA: NEGATIVE
Leukocytes, UA: NEGATIVE
NITRITE UA: NEGATIVE
PH UA: 5
Protein Ur, POC: NEGATIVE
RBC UA: NEGATIVE
Spec Grav, UA: 1.025
UROBILINOGEN UA: 0.2

## 2016-01-21 LAB — POC MICROSCOPIC URINALYSIS (UMFC): Mucus: ABSENT

## 2016-01-21 MED ORDER — METAXALONE 800 MG PO TABS: 800.0000 mg | ORAL_TABLET | Freq: Four times a day (QID) | ORAL | 1 refills | Status: AC | PRN

## 2016-01-21 MED ORDER — ESTROGENS, CONJUGATED 0.625 MG/GM VA CREA: 1.0000 | TOPICAL_CREAM | Freq: Every day | VAGINAL | 12 refills | Status: AC

## 2016-01-21 MED ORDER — FROVATRIPTAN SUCCINATE 2.5 MG PO TABS: 2.5000 mg | ORAL_TABLET | ORAL | 11 refills | Status: AC | PRN

## 2016-01-21 MED ORDER — KETOROLAC TROMETHAMINE 10 MG PO TABS: 10.0000 mg | ORAL_TABLET | Freq: Four times a day (QID) | ORAL | 3 refills | Status: AC | PRN

## 2016-01-21 MED ORDER — TOPIRAMATE 25 MG PO TABS: 25.0000 mg | ORAL_TABLET | Freq: Two times a day (BID) | ORAL | 3 refills | Status: AC

## 2016-01-21 NOTE — Patient Instructions (Addendum)
   IF you received an x-ray today, you will receive an invoice from [facility]. Please contact [facility] at [phone] with questions or concerns regarding your invoice.   IF you received labwork today, you will receive an invoice from [facility]. Please contact [facility] at [phone] with questions or concerns regarding your invoice.   Our billing staff will not be able to assist you with questions regarding bills from these companies.  You will be contacted with the lab results as soon as they are available. The fastest way to get your results is to activate your My Chart account. Instructions are located on the last page of this paperwork. If you have not heard from us regarding the results in 2 weeks, please contact this office.    Keeping You Healthy  Get These Tests  Blood Pressure- Have your blood pressure checked by your healthcare provider at least once a year.  Normal blood pressure is 120/80.  Weight- Have your body mass index (BMI) calculated to screen for obesity.  BMI is a measure of body fat based on height and weight.  You can calculate your own BMI at www.nhlbisupport.com/bmi/  Cholesterol- Have your cholesterol checked every year.  Diabetes- Have your blood sugar checked every year if you have high blood pressure, high cholesterol, a family history of diabetes or if you are overweight.  Pap Test - Have a pap test every 1 to 5 years if you have been sexually active.  If you are older than 65 and recent pap tests have been normal you may not need additional pap tests.  In addition, if you have had a hysterectomy  for benign disease additional pap tests are not necessary.  Mammogram-Yearly mammograms are essential for early detection of breast cancer  Screening for Colon Cancer- Colonoscopy starting at age 50. Screening may begin sooner depending on your family history and other health conditions.  Follow up colonoscopy  as directed by your Gastroenterologist.  Screening for Osteoporosis- Screening begins at age 65 with bone density scanning, sooner if you are at higher risk for developing Osteoporosis.  Get these medicines  Calcium with Vitamin D- Your body requires 1200-1500 mg of Calcium a day and 800-1000 IU of Vitamin D a day.  You can only absorb 500 mg of Calcium at a time therefore Calcium must be taken in 2 or 3 separate doses throughout the day.  Hormones- Hormone therapy has been associated with increased risk for certain cancers and heart disease.  Talk to your healthcare provider about if you need relief from menopausal symptoms.  Aspirin- Ask your healthcare provider about taking Aspirin to prevent Heart Disease and Stroke.  Get these Immuniztions  Flu shot- Every fall  Pneumonia shot- Once after the age of 65; if you are younger ask your healthcare provider if you need a pneumonia shot.  Tetanus- Every ten years.  Zostavax- Once after the age of 60 to prevent shingles.  Take these steps  Don't smoke- Your healthcare provider can help you quit. For tips on how to quit, ask your healthcare provider or go to www.smokefree.gov or call 1-800 QUIT-NOW.  Be physically active- Exercise 5 days a week for a minimum of 30 minutes.  If you are not already physically active, start slow and gradually work up to 30 minutes of moderate physical activity.  Try walking, dancing, bike riding, swimming, etc.  Eat a healthy diet- Eat a variety of healthy foods such as fruits, vegetables, whole   grains, low fat milk, low fat cheeses, yogurt, lean meats, chicken, fish, eggs, dried beans, tofu, etc.  For more information go to www.thenutritionsource.org  Dental visit- Brush and floss teeth twice daily; visit your dentist twice a year.  Eye exam- Visit your Optometrist or Ophthalmologist yearly.  Drink alcohol in moderation- Limit alcohol intake to one drink or less a day.  Never drink and  drive.  Depression- Your emotional health is as important as your physical health.  If you're feeling down or losing interest in things you normally enjoy, please talk to your healthcare provider.  Seat Belts- can save your life; always wear one  Smoke/Carbon Monoxide detectors- These detectors need to be installed on the appropriate level of your home.  Replace batteries at least once a year.  Violence- If anyone is threatening or hurting you, please tell your healthcare provider.  Living Will/ Health care power of attorney- Discuss with your healthcare provider and family. 

## 2016-01-21 NOTE — Progress Notes (Signed)
Subjective:    Patient ID: Jenny Price, female    DOB: 02/03/1961, 55 y.o.   MRN: [id number]  01/21/2016  Annual Exam   HPI This 55 y.o. female presents for Complete Physical Examination.  Last physical:  03-19-14 Pap smear:  03-19-14; LMP years ago.   Mammogram: 10-04-14 Colonoscopy:  10-05-2014 TDAP:  04/05/2007 Influenza: annual  Eye exam:  10/2015; +cataracts Dental exam:  2 weeks ago.  High risk sexual behavior: condom broke.  Lots of unprotected sex.  Requesting STD screening.    Vaginal atrophy: sucks; painful.  Tried Prempro.    Migraines: less frequent; still very severe; incapacitates when does occur.  Takes Ketodolac, Flexeril, Frova, Topamax.    Review of Systems  Constitutional: Negative for activity change, appetite change, chills, diaphoresis, fatigue, fever and unexpected weight change.  HENT: Negative for congestion, dental problem, drooling, ear discharge, ear pain, facial swelling, hearing loss, mouth sores, nosebleeds, postnasal drip, rhinorrhea, sinus pressure, sneezing, sore throat, tinnitus, trouble swallowing and voice change.   Eyes: Negative for photophobia, pain, discharge, redness, itching and visual disturbance.  Respiratory: Negative for apnea, cough, choking, chest tightness, shortness of breath, wheezing and stridor.   Cardiovascular: Negative for chest pain, palpitations and leg swelling.  Gastrointestinal: Negative for abdominal distention, abdominal pain, anal bleeding, blood in stool, constipation, diarrhea, nausea, rectal pain and vomiting.  Endocrine: Negative for cold intolerance, heat intolerance, polydipsia, polyphagia and polyuria.  Genitourinary: Negative for decreased urine volume, difficulty urinating, dyspareunia, dysuria, enuresis, flank pain, frequency, genital sores, hematuria, menstrual problem, pelvic pain, urgency, vaginal bleeding, vaginal discharge and vaginal pain.  Musculoskeletal: Negative for arthralgias, back pain, gait  problem, joint swelling, myalgias, neck pain and neck stiffness.  Skin: Negative for color change, pallor, rash and wound.  Allergic/Immunologic: Negative for environmental allergies, food allergies and immunocompromised state.  Neurological: Negative for dizziness, tremors, seizures, syncope, facial asymmetry, speech difficulty, weakness, light-headedness, numbness and headaches.  Hematological: Negative for adenopathy. Does not bruise/bleed easily.  Psychiatric/Behavioral: Negative for agitation, behavioral problems, confusion, decreased concentration, dysphoric mood, hallucinations, self-injury, sleep disturbance and suicidal ideas. The patient is not nervous/anxious and is not hyperactive.     Past Medical History:  Diagnosis Date  . Chicken pox   . Decreased libido   . Lumbago   . Migraine with aura, without mention of intractable migraine without mention of status migrainosus   . Other malaise and fatigue   . Papanicolaou smear of cervix with atypical squamous cells of undetermined significance (ASC-US)   . Unspecified contraceptive management   . Unspecified vitamin D deficiency    Past Surgical History:  Procedure Laterality Date  . vein removal     left leg     [facility]   No Known Allergies Current Outpatient Prescriptions  Medication Sig Dispense Refill  . cyclobenzaprine (FLEXERIL) 5 MG tablet Take 5 mg by mouth 3 (three) times daily as needed.    . frovatriptan (FROVA) 2.5 MG tablet Take 1 tablet (2.5 mg total) by mouth as needed for migraine. If recurs, may repeat after 2 hours. Max of 3 tabs in 24 hours. 8 tablet 11  . ketorolac (TORADOL) 10 MG tablet Take 1 tablet (10 mg total) by mouth every 6 (six) hours as needed. 40 tablet 3  . topiramate (TOPAMAX) 25 MG tablet Take 1 tablet (25 mg total) by mouth 2 (two) times daily. 180 tablet 3  . conjugated estrogens (PREMARIN) vaginal cream Place 1 Applicatorful vaginally daily. 42.5 g 12  .  estrogen,  conjugated,-medroxyprogesterone (PREMPRO) 0.625-5 MG per tablet Take 1 tablet by mouth daily. (Patient not taking: Reported on 01/21/2016) 90 tablet 3  . metaxalone (SKELAXIN) 800 MG tablet Take 1 tablet (800 mg total) by mouth 4 (four) times daily as needed for muscle spasms. 60 tablet 1  . Vitamin D, Ergocalciferol, (DRISDOL) 50000 UNITS CAPS Take 50,000 Units by mouth every 7 (seven) days.     No current facility-administered medications for this visit.    Social History   Social History  . Marital status: Single    Spouse name: N/A  . Number of children: 2  . Years of education: college   Occupational History  . pharmacist    Social History Main Topics  . Smoking status: Never Smoker  . Smokeless tobacco: Not on file  . Alcohol use Yes     Comment: occasional 4 drinks per week -wine and beer  . Drug use: No  . Sexual activity: Not on file   Other Topics Concern  . Not on file   Social History Narrative   Marital status:  Divorced in 2000 after 16 years.  Dating casually in 2017.      Lives: with daughter (27yo).      Children:  2 children (1 son [id number], 1 daughter [id number]); no grandchildren.      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  Office in [place] in 2017.      Alcohol: 3-5 drinks per week.      Exercise: walking two days per week, plays tennis.Always uses seat belts. Smoke alarm and carbon monoxide detector in the home. Caffeine use: consumes a moderate amount.      Seatbelt: 100%   Family History  Problem Relation Age of Onset  . Arthritis Mother   . Cancer Father     esophageal and lung  . Alcohol abuse Father   . Hyperlipidemia Father   . Lumbar disc disease Sister     DDD  . Alcohol abuse Sister   . Hypertension Brother   . Hyperlipidemia Brother   . Diabetes Maternal Grandmother   . Stroke Paternal Grandmother   . Diabetes Paternal Grandfather        Objective:    BP 134/76 (BP Location: Right Arm, Patient Position: Sitting, Cuff Size: Normal)    Pulse 65   Temp 98.5 F (36.9 C) (Oral)   Ht 5' 0.5" (1.537 m)   Wt 148 lb 9.6 oz (67.4 kg)   BMI 28.54 kg/m  Physical Exam  Constitutional: She is oriented to person, place, and time. She appears well-developed and well-nourished. No distress.  HENT:  Head: Normocephalic and atraumatic.  Right Ear: External ear normal.  Left Ear: External ear normal.  Nose: Nose normal.  Mouth/Throat: Oropharynx is clear and moist.  Eyes: Conjunctivae and EOM are normal. Pupils are equal, round, and reactive to light.  Neck: Normal range of motion and full passive range of motion without pain. Neck supple. No JVD present. Carotid bruit is not present. No thyromegaly present.  Cardiovascular: Normal rate, regular rhythm and normal heart sounds.  Exam reveals no gallop and no friction rub.   No murmur heard. Pulmonary/Chest: Effort normal and breath sounds normal. She has no wheezes. She has no rales. Right breast exhibits no inverted nipple, no mass, no nipple discharge, no skin change and no tenderness. Left breast exhibits no inverted nipple, no mass, no nipple discharge, no skin change and no tenderness. Breasts are symmetrical.  Abdominal: Soft.  Bowel sounds are normal. She exhibits no distension and no mass. There is no tenderness. There is no rebound and no guarding.  Genitourinary: Vagina normal and uterus normal. There is no rash, tenderness or lesion on the right labia. There is no rash, tenderness or lesion on the left labia. Cervix exhibits no motion tenderness and no friability. Right adnexum displays no mass, no tenderness and no fullness. Left adnexum displays no mass, no tenderness and no fullness.  Musculoskeletal:       Right shoulder: Normal.       Left shoulder: Normal.       Cervical back: Normal.  Lymphadenopathy:    She has no cervical adenopathy.  Neurological: She is alert and oriented to person, place, and time. She has normal reflexes. No cranial nerve deficit. She exhibits  normal muscle tone. Coordination normal.  Skin: Skin is warm and dry. No rash noted. She is not diaphoretic. No erythema. No pallor.  Psychiatric: She has a normal mood and affect. Her behavior is normal. Judgment and thought content normal.  Nursing note and vitals reviewed.       Assessment & Plan:   1. Routine physical examination   2. Migraine with aura and without status migrainosus, not intractable   3. Vitamin D deficiency   4. Screening for diabetes mellitus   5. Screening, lipid   6. Screening for HIV (human immunodeficiency virus)   7. Need for hepatitis C screening test   8. Cervical cancer screening   9. Atrophic vaginitis   10. High risk sexual behavior   11. Screening for cardiovascular condition     Orders Placed This Encounter  Procedures  . CBC with Differential/Platelet  . HIV antibody  . Lipid panel    Order Specific Question:   Has the patient fasted?    Answer:   Yes  . TSH  . Hemoglobin A1c  . Hepatitis C antibody  . Comprehensive metabolic panel    Order Specific Question:   Has the patient fasted?    Answer:   Yes  . RPR  . POCT urinalysis dipstick  . POCT Microscopic Urinalysis ([facility])  . POCT Wet + KOH Prep  . EKG 12-Lead   Meds ordered this encounter  Medications  . topiramate (TOPAMAX) 25 MG tablet    Sig: Take 1 tablet (25 mg total) by mouth 2 (two) times daily.    Dispense:  180 tablet    Refill:  3  . ketorolac (TORADOL) 10 MG tablet    Sig: Take 1 tablet (10 mg total) by mouth every 6 (six) hours as needed.    Dispense:  40 tablet    Refill:  3  . frovatriptan (FROVA) 2.5 MG tablet    Sig: Take 1 tablet (2.5 mg total) by mouth as needed for migraine. If recurs, may repeat after 2 hours. Max of 3 tabs in 24 hours.    Dispense:  8 tablet    Refill:  11  . metaxalone (SKELAXIN) 800 MG tablet    Sig: Take 1 tablet (800 mg total) by mouth 4 (four) times daily as needed for muscle spasms.    Dispense:  60 tablet    Refill:  1  .  conjugated estrogens (PREMARIN) vaginal cream    Sig: Place 1 Applicatorful vaginally daily.    Dispense:  42.5 g    Refill:  12    Return in about 1 year (around 01/20/2017) for complete physical examiniation.   Marliyah Reid Daphine Deutscher  Tamala Julian, M.D. [facility] [address] [place], [place]  [phone] phone [phone] fax

## 2016-01-22 LAB — TSH: TSH: 2.34 m[IU]/L

## 2016-01-22 LAB — COMPREHENSIVE METABOLIC PANEL WITH GFR
ALT: 16 U/L (ref 6–29)
AST: 16 U/L (ref 10–35)
Albumin: 4.2 g/dL (ref 3.6–5.1)
Alkaline Phosphatase: 75 U/L (ref 33–130)
BUN: 16 mg/dL (ref 7–25)
CO2: 24 mmol/L (ref 20–31)
Calcium: 9.6 mg/dL (ref 8.6–10.4)
Chloride: 106 mmol/L (ref 98–110)
Creat: 0.92 mg/dL (ref 0.50–1.05)
Glucose, Bld: 88 mg/dL (ref 65–99)
Potassium: 4.1 mmol/L (ref 3.5–5.3)
Sodium: 141 mmol/L (ref 135–146)
Total Bilirubin: 0.4 mg/dL (ref 0.2–1.2)
Total Protein: 7.2 g/dL (ref 6.1–8.1)

## 2016-01-22 LAB — HEMOGLOBIN A1C
Hgb A1c MFr Bld: 5.5 %
Mean Plasma Glucose: 111 mg/dL

## 2016-01-22 LAB — RPR

## 2016-01-22 LAB — LIPID PANEL
Cholesterol: 281 mg/dL — ABNORMAL HIGH (ref 125–200)
HDL: 57 mg/dL
LDL Cholesterol: 193 mg/dL — ABNORMAL HIGH
Total CHOL/HDL Ratio: 4.9 ratio
Triglycerides: 153 mg/dL — ABNORMAL HIGH
VLDL: 31 mg/dL — ABNORMAL HIGH

## 2016-01-22 LAB — HEPATITIS C ANTIBODY: HCV Ab: NEGATIVE

## 2016-01-22 LAB — HIV ANTIBODY (ROUTINE TESTING W REFLEX): HIV 1&2 Ab, 4th Generation: NONREACTIVE

## 2016-01-23 LAB — PAP IG, CT-NG NAA, HPV HIGH-RISK
CHLAMYDIA PROBE AMP: NOT DETECTED
GC Probe Amp: NOT DETECTED
HPV DNA High Risk: NOT DETECTED

## 2016-03-03 ENCOUNTER — Encounter: Payer: Self-pay | Admitting: Family Medicine

## 2016-03-05 NOTE — Telephone Encounter (Signed)
Dr Katrinka BlazingSmith, I do not see that these labs were done. Do you want to order them for a lab only visit if pt wants to return to have them done?

## 2016-03-09 ENCOUNTER — Other Ambulatory Visit: Payer: Self-pay | Admitting: Family Medicine

## 2016-03-09 DIAGNOSIS — Z113 Encounter for screening for infections with a predominantly sexual mode of transmission: Secondary | ICD-10-CM

## 2017-11-01 ENCOUNTER — Encounter: Payer: Self-pay | Admitting: Family Medicine

## 2017-11-04 ENCOUNTER — Encounter (INDEPENDENT_AMBULATORY_CARE_PROVIDER_SITE_OTHER): Payer: BLUE CROSS/BLUE SHIELD | Admitting: Ophthalmology

## 2017-11-04 DIAGNOSIS — H43813 Vitreous degeneration, bilateral: Secondary | ICD-10-CM | POA: Diagnosis not present

## 2017-11-04 DIAGNOSIS — H2513 Age-related nuclear cataract, bilateral: Secondary | ICD-10-CM | POA: Diagnosis not present

## 2017-11-04 DIAGNOSIS — H33022 Retinal detachment with multiple breaks, left eye: Secondary | ICD-10-CM

## 2017-11-12 ENCOUNTER — Encounter (INDEPENDENT_AMBULATORY_CARE_PROVIDER_SITE_OTHER): Payer: BLUE CROSS/BLUE SHIELD | Admitting: Ophthalmology

## 2017-11-15 ENCOUNTER — Encounter (INDEPENDENT_AMBULATORY_CARE_PROVIDER_SITE_OTHER): Payer: BLUE CROSS/BLUE SHIELD | Admitting: Ophthalmology

## 2017-11-22 ENCOUNTER — Encounter (INDEPENDENT_AMBULATORY_CARE_PROVIDER_SITE_OTHER): Payer: BLUE CROSS/BLUE SHIELD | Admitting: Ophthalmology

## 2017-11-22 DIAGNOSIS — H33302 Unspecified retinal break, left eye: Secondary | ICD-10-CM

## 2018-03-24 ENCOUNTER — Encounter (INDEPENDENT_AMBULATORY_CARE_PROVIDER_SITE_OTHER): Payer: BLUE CROSS/BLUE SHIELD | Admitting: Ophthalmology

## 2018-03-24 DIAGNOSIS — H33302 Unspecified retinal break, left eye: Secondary | ICD-10-CM

## 2018-03-24 DIAGNOSIS — H43813 Vitreous degeneration, bilateral: Secondary | ICD-10-CM

## 2018-03-24 DIAGNOSIS — H5213 Myopia, bilateral: Secondary | ICD-10-CM | POA: Diagnosis not present

## 2018-03-24 DIAGNOSIS — H2513 Age-related nuclear cataract, bilateral: Secondary | ICD-10-CM | POA: Diagnosis not present
